# Patient Record
Sex: Female | Born: 2000
Health system: Southern US, Community
[De-identification: ages and names within clinical notes are randomized; demographics above are authoritative.]

## PROBLEM LIST (undated history)

## (undated) DIAGNOSIS — F819 Developmental disorder of scholastic skills, unspecified: Secondary | ICD-10-CM

## (undated) DIAGNOSIS — Z973 Presence of spectacles and contact lenses: Secondary | ICD-10-CM

## (undated) DIAGNOSIS — J302 Other seasonal allergic rhinitis: Secondary | ICD-10-CM

## (undated) DIAGNOSIS — F913 Oppositional defiant disorder: Secondary | ICD-10-CM

## (undated) DIAGNOSIS — J45909 Unspecified asthma, uncomplicated: Secondary | ICD-10-CM

## (undated) DIAGNOSIS — F909 Attention-deficit hyperactivity disorder, unspecified type: Secondary | ICD-10-CM

## (undated) DIAGNOSIS — IMO0001 Reserved for inherently not codable concepts without codable children: Secondary | ICD-10-CM

## (undated) DIAGNOSIS — Z9289 Personal history of other medical treatment: Secondary | ICD-10-CM

## (undated) DIAGNOSIS — R4689 Other symptoms and signs involving appearance and behavior: Secondary | ICD-10-CM

## (undated) DIAGNOSIS — IMO0002 Reserved for concepts with insufficient information to code with codable children: Secondary | ICD-10-CM

## (undated) HISTORY — DX: Reserved for concepts with insufficient information to code with codable children: IMO0002

## (undated) HISTORY — DX: Presence of spectacles and contact lenses: Z97.3

## (undated) HISTORY — DX: Other symptoms and signs involving appearance and behavior: R46.89

## (undated) HISTORY — DX: Attention-deficit hyperactivity disorder, unspecified type: F90.9

## (undated) HISTORY — DX: Reserved for inherently not codable concepts without codable children: IMO0001

## (undated) HISTORY — DX: Unspecified asthma, uncomplicated: J45.909

## (undated) HISTORY — PX: UMBILICAL HERNIA REPAIR: SUR1181

## (undated) HISTORY — PX: TONSILLECTOMY AND ADENOIDECTOMY: SUR1326

## (undated) HISTORY — DX: Developmental disorder of scholastic skills, unspecified: F81.9

## (undated) HISTORY — DX: Personal history of other medical treatment: Z92.89

## (undated) HISTORY — DX: Other seasonal allergic rhinitis: J30.2

## (undated) HISTORY — DX: Oppositional defiant disorder: F91.3

---
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
## 2022-01-06 ENCOUNTER — Encounter: Payer: Self-pay | Admitting: Internal Medicine

## 2022-02-19 ENCOUNTER — Ambulatory Visit: Payer: 59 | Admitting: Nurse Practitioner

## 2022-02-19 NOTE — Patient Instructions (Incomplete)
It was a pleasure seeing you today! Thank you for trusting me with your care.   We have ordered the following tests today: HCG- blood  This will test for positive pregnancy and give [place] an idea how far along you may be.   Even if you are not planning pregnancy at this time, I recommend that all women who could possibly get pregnant take a daily multivitamin with folic acid. This is a very important vitamin needed to help the spinal cord and brain form properly during Shayda Kalka pregnancy and it is best to have these levels normal at the time of pregnancy since this is the first area to develop. You can find prenatal vitamins in the vitamin section at stores that sell vitamins.    If labs were collected today, you will see the results as soon as they become available in MyChart. I will review these labs once I have received all of the results and send you comments and recommendations, if any. If you have specific concerns, we can set up an appointment (virtual or in person) to go over details and come up with a plan together.   If you have received any referrals today, the office where the referral was made will be in contact with you to set up your appointment.   If you have received orders for imaging today, the imaging office will contact you to schedule this. Please note that some imaging requires a prior authorization from insurance, so an order is not a guarantee this will be covered by your insurance, but I want to assure you we will do our best to get this covered and if it is not, you will be notified and we can come up with an alternative plan.   If you take regular prescription medications, please contact your pharmacy for routine refill requests. They will send this directly to [place].  If you were ordered new medication as a part of your examination and treatment today, please contact your pharmacy to determine the status. Many prescriptions require a prior authorization and the pharmacy will  contact us to get this started. This may take a few days for approval or denial. If the medication is denied, we will work with you to try alternative medications.   If you have any questions or concerns, please do not hesitate to contact the office via telephone or MyChart.  MyChart messages are received by the CMA staff during regular business hours Monday through Friday and we do our best to respond in a timely manner.  If your request requires an appointment, the staff will gladly help set that up so that we have the time dedicated to ensure that your questions are appropriately answered.

## 2022-02-19 NOTE — Progress Notes (Deleted)
  Tollie Eth, DNP, AGNP-c [facility] [address] [place], [place] [address] [phone]  Subjective:   Rosario Vazguez is a 21 y.o. female presents to day for evaluation of: Pregnancy Testing LMP   PMH, Medications, and Allergies reviewed and updated in chart as appropriate.   ROS negative except for what is listed in HPI. Objective:  There were no vitals taken for this visit. Physical Exam        Assessment & Plan:   Problem List Items Addressed This Visit   None     Tollie Eth, DNP, AGNP-c 02/19/2022  6:06 AM    History, Medications, Surgery, SDOH, and Family History reviewed and updated as appropriate.

## 2022-02-23 NOTE — L&D Delivery Note (Signed)
Delivery Note At 6:48 PM a non-viable female was delivered via Vaginal, Spontaneous (Presentation:   transverse, right arm).  APGAR: n/a; weight  pending.   Baby was delivered upon my arrival.  I delivered the placenta. Placenta status: Spontaneous, Intact.  Cord: 3VUnknown with the following complications:  None.  Cord pH: n/a  Anesthesia: None Episiotomy: None Lacerations: None Suture Repair:  none Est. Blood Loss (mL): 43  Mom to  ob specialty care .  Baby to [place]. Anora testing sent [place] 11/11/2022, 7:10 PM

## 2022-03-23 ENCOUNTER — Telehealth: Payer: Self-pay

## 2022-03-23 NOTE — Telephone Encounter (Signed)
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 Doing ok  Any questions or concerns? No  Items Reviewed: Did the pt receive and understand the discharge instructions provided? Yes  Medications obtained and verified? Yes  Other? No  Any new allergies since your discharge? No  Dietary orders reviewed? Yes Do you have support at home? Yes   Home Care and Equipment/Supplies: Were home health services ordered? no If so, what is the name of the agency? na  Has the agency set up a time to come to the patient's home? not applicable Were any new equipment or medical supplies ordered?  No What is the name of the medical supply agency? na Were you able to get the supplies/equipment? not applicable Do you have any questions related to the use of the equipment or supplies? No  Functional Questionnaire: (I = Independent and D = Dependent) ADLs: I  Bathing/Dressing- I  Meal Prep- I  Eating- I  Maintaining continence- I  Transferring/Ambulation- I  Managing Meds- I  Follow up appointments reviewed:  PCP Hospital f/u appt confirmed? No  . [facility] f/u appt confirmed? Yes  Patient plans to make fu appt with psychiatrist  Are transportation arrangements needed? No  If their condition worsens, is the pt aware to call PCP or go to the Emergency Dept.? Yes Was the patient provided with contact information for the PCP's office or ED? Yes Was to pt encouraged to call back with questions or concerns? Yes   Juanda Crumble LPN [place] Direct Dial [phone]

## 2022-07-16 ENCOUNTER — Ambulatory Visit: Payer: 59 | Admitting: Nurse Practitioner

## 2022-11-09 ENCOUNTER — Inpatient Hospital Stay (HOSPITAL_COMMUNITY)
Admission: AD | Admit: 2022-11-09 | Discharge: 2022-11-13 | DRG: 807 | Disposition: A | Discharge status: Home / Self Care | Payer: 59 | Attending: Obstetrics and Gynecology | Admitting: Obstetrics and Gynecology

## 2022-11-09 ENCOUNTER — Encounter (HOSPITAL_COMMUNITY): Payer: Self-pay | Admitting: Obstetrics and Gynecology

## 2022-11-09 ENCOUNTER — Inpatient Hospital Stay (HOSPITAL_BASED_OUTPATIENT_CLINIC_OR_DEPARTMENT_OTHER): Payer: 59

## 2022-11-09 ENCOUNTER — Other Ambulatory Visit: Payer: Self-pay

## 2022-11-09 DIAGNOSIS — F319 Bipolar disorder, unspecified: Secondary | ICD-10-CM | POA: Diagnosis present

## 2022-11-09 DIAGNOSIS — D509 Iron deficiency anemia, unspecified: Secondary | ICD-10-CM | POA: Diagnosis present

## 2022-11-09 DIAGNOSIS — F909 Attention-deficit hyperactivity disorder, unspecified type: Secondary | ICD-10-CM | POA: Diagnosis present

## 2022-11-09 DIAGNOSIS — Z79899 Other long term (current) drug therapy: Secondary | ICD-10-CM | POA: Diagnosis not present

## 2022-11-09 DIAGNOSIS — J45909 Unspecified asthma, uncomplicated: Secondary | ICD-10-CM | POA: Diagnosis present

## 2022-11-09 DIAGNOSIS — O364XX0 Maternal care for intrauterine death, not applicable or unspecified: Secondary | ICD-10-CM | POA: Diagnosis present

## 2022-11-09 DIAGNOSIS — O021 Missed abortion: Secondary | ICD-10-CM

## 2022-11-09 DIAGNOSIS — O99344 Other mental disorders complicating childbirth: Secondary | ICD-10-CM | POA: Diagnosis present

## 2022-11-09 DIAGNOSIS — F913 Oppositional defiant disorder: Secondary | ICD-10-CM | POA: Diagnosis present

## 2022-11-09 DIAGNOSIS — O9902 Anemia complicating childbirth: Secondary | ICD-10-CM | POA: Diagnosis present

## 2022-11-09 DIAGNOSIS — Z30017 Encounter for initial prescription of implantable subdermal contraceptive: Secondary | ICD-10-CM

## 2022-11-09 DIAGNOSIS — O9952 Diseases of the respiratory system complicating childbirth: Secondary | ICD-10-CM | POA: Diagnosis present

## 2022-11-09 DIAGNOSIS — Z825 Family history of asthma and other chronic lower respiratory diseases: Secondary | ICD-10-CM | POA: Diagnosis not present

## 2022-11-09 DIAGNOSIS — Z8659 Personal history of other mental and behavioral disorders: Secondary | ICD-10-CM

## 2022-11-09 DIAGNOSIS — Z8249 Family history of ischemic heart disease and other diseases of the circulatory system: Secondary | ICD-10-CM

## 2022-11-09 DIAGNOSIS — Z3A20 20 weeks gestation of pregnancy: Secondary | ICD-10-CM | POA: Diagnosis not present

## 2022-11-09 LAB — CBC WITH DIFFERENTIAL/PLATELET
Abs Immature Granulocytes: 0.01 10*3/uL (ref 0.00–0.07)
Basophils Absolute: 0 10*3/uL (ref 0.0–0.1)
Basophils Relative: 0 %
Eosinophils Absolute: 0.1 10*3/uL (ref 0.0–0.5)
Eosinophils Relative: 1 %
HCT: 31.5 % — ABNORMAL LOW (ref 36.0–46.0)
Hemoglobin: 10.4 g/dL — ABNORMAL LOW (ref 12.0–15.0)
Immature Granulocytes: 0 %
Lymphocytes Relative: 37 %
Lymphs Abs: 2.2 10*3/uL (ref 0.7–4.0)
MCH: 26.5 pg (ref 26.0–34.0)
MCHC: 33 g/dL (ref 30.0–36.0)
MCV: 80.4 fL (ref 80.0–100.0)
Monocytes Absolute: 0.6 10*3/uL (ref 0.1–1.0)
Monocytes Relative: 10 %
Neutro Abs: 3 10*3/uL (ref 1.7–7.7)
Neutrophils Relative %: 52 %
Platelets: 342 10*3/uL (ref 150–400)
RBC: 3.92 MIL/uL (ref 3.87–5.11)
RDW: 14.6 % (ref 11.5–15.5)
WBC: 5.8 10*3/uL (ref 4.0–10.5)
nRBC: 0 % (ref 0.0–0.2)

## 2022-11-09 LAB — OB RESULTS CONSOLE RPR: RPR: NONREACTIVE

## 2022-11-09 LAB — OB RESULTS CONSOLE RUBELLA ANTIBODY, IGM: Rubella: IMMUNE

## 2022-11-09 LAB — OB RESULTS CONSOLE HIV ANTIBODY (ROUTINE TESTING): HIV: NONREACTIVE

## 2022-11-09 LAB — OB RESULTS CONSOLE HEPATITIS B SURFACE ANTIGEN: Hepatitis B Surface Ag: NEGATIVE

## 2022-11-09 LAB — OB RESULTS CONSOLE ABO/RH: RH Type: POSITIVE

## 2022-11-09 MED ORDER — FENTANYL CITRATE (PF) 100 MCG/2ML IJ SOLN
50.0000 ug | INTRAMUSCULAR | Status: DC | PRN
  Administered 2022-11-11 (×3): 100 ug via INTRAVENOUS
  Filled 2022-11-09 (×3): qty 2

## 2022-11-09 MED ORDER — SERTRALINE HCL 50 MG PO TABS
75.0000 mg | ORAL_TABLET | Freq: Every day | ORAL | Status: DC
  Administered 2022-11-09 – 2022-11-11 (×3): 75 mg via ORAL
  Filled 2022-11-09: qty 1
  Filled 2022-11-09: qty 2
  Filled 2022-11-09 (×2): qty 1

## 2022-11-09 MED ORDER — ACETAMINOPHEN 325 MG PO TABS
650.0000 mg | ORAL_TABLET | ORAL | Status: DC | PRN
  Administered 2022-11-09: 650 mg via ORAL
  Filled 2022-11-09: qty 2

## 2022-11-09 MED ORDER — MISOPROSTOL 200 MCG PO TABS
200.0000 ug | ORAL_TABLET | ORAL | Status: AC | PRN
  Administered 2022-11-09 – 2022-11-10 (×3): 200 ug via VAGINAL
  Filled 2022-11-09 (×3): qty 1

## 2022-11-09 MED ORDER — LAMOTRIGINE 25 MG PO TABS
25.0000 mg | ORAL_TABLET | Freq: Two times a day (BID) | ORAL | Status: DC
  Administered 2022-11-09 – 2022-11-11 (×5): 25 mg via ORAL
  Filled 2022-11-09 (×7): qty 1

## 2022-11-09 NOTE — H&P (Addendum)
Bailey [name] is a 22 y.o. female G2P0010 at 20 weeks 6 days EGA by LMP of 06/16/22 and EDC of 03/23/2023 presenting with fetal demise detected in ultrasound at the office.  Patient reports no fetal movement detected today.   Prenatal care received at [facility].  Prenatal course significant for: Bipolar disorder and patient was on lamictal and sertaline since early pregnancy.  Fetal anomalies detected on anatomy ultrasound done 11/05/22: Abnormal cerebellum with flattened cerebellum, 3rd and 4th ventricles dilated, arachnoid cyst, pericardial effusion, multiple echogennic intracardiac foci, intrahepatic portal vein is prominent, with a linear vein course instead of curved, absent nasal bone, EFW 294 grams, 10 oz, 11th%.  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  Panorama test showed low risk female fetus. AFP only test was negative.  Inderteminate antibody screen on new OB labs, not clinically significant.  Repeat testing in 4 weeks recommended.    OB History   No obstetric history on file.    Past Medical History:  Diagnosis Date   ADHD (attention deficit hyperactivity disorder) 2002/04/13   psychoeducational eval, Era Skeen PhD.   Asthma    diagnosed age 5yo   Behavior problem 12/2011   psychological eval through school   History of EKG 2014   normal   Learning disability    Menarche 10/2014   Oppositional behavior    Seasonal allergic rhinitis    Wears glasses    Well child check 10/07/2015   Past Surgical History:  Procedure Laterality Date   TONSILLECTOMY AND ADENOIDECTOMY     age 84yo   UMBILICAL HERNIA REPAIR     age 70mo   Family History: family history includes Asthma in her brother, father, and sister; Cancer in her maternal grandfather and maternal grandmother; Fibromyalgia in her mother; Heart disease in her maternal grandmother; Hypertension in her maternal grandmother. Social History:  reports that she has  never smoked. She has never used smokeless tobacco. She reports that she does not drink alcohol and does not use drugs.     Maternal Diabetes: No Genetic Screening: Abnormal:  Results: Other: Maternal Ultrasounds/Referrals: Fetal Heart Anomalies, Isolated EIF (echogenic intracardiac focus), Absent nasal bone, and Other: Fetal Ultrasounds or other Referrals:  None Maternal Substance Abuse:  No Significant Maternal Medications:  See below.  Significant Maternal Lab Results:  Other:  Number of Prenatal Visits:greater than 3 verified prenatal visits Other Comments:  None  Review of Systems Constitutional: Denies fevers/chills Cardiovascular: Denies chest pain or palpitations Pulmonary: Denies coughing or wheezing Gastrointestinal: Denies nausea, vomiting or diarrhea Genitourinary: Denies pelvic pain, unusual vaginal bleeding, unusual vaginal discharge, dysuria, urgency or frequency.  Musculoskeletal: Denies muscle or joint aches and pain.  Neurology: Denies abnormal sensations such as tingling or numbness.    History   There were no vitals taken for this visit. Exam Constitutional: She is oriented to person, place, and time. She appears well-developed and well-nourished.  HENT:  Head: Normocephalic and atraumatic.  Eyes: Conjunctivae and EOM are normal.  Neck: Normal range of motion. Neck supple.  Cardiovascular: Normal rate, regular rhythm, normal heart sounds and intact distal pulses.  Respiratory: Effort normal and breath sounds normal.  GI: Soft. She exhibits no mass. There is no tenderness.  Genitourinary: Deferred.   Musculoskeletal: Normal range of motion.  Neurological: She is alert and oriented to person, place, and time.  Skin: Skin is warm and dry.  Psychiatric: She has a normal mood and affect. Judgment normal.  Prenatal labs: ABO, Rh:  B positive. Antibody:  Positive (non specific, clinically insignificant).  Rubella:  Immune.  RPR:   NR. HBsAg:  Neg.   HIV:   neg.  GBS:   Unknown.   Current Outpatient Medications  Medication Instructions   albuterol (VENTOLIN HFA) 108 (90 Base) MCG/ACT inhaler 1 puff, Inhalation, Every 6 hours PRN   Concerta 27 mg, Oral, Every morning   hydrOXYzine (ATARAX) 25 mg, Oral, 2 times daily   lamoTRIgine (LAMICTAL) 25 MG tablet Oral   montelukast (SINGULAIR) 10 MG tablet TAKE 1 TABLET BY MOUTH EVERYDAY AT BEDTIME   sertraline (ZOLOFT) 25 mg, Oral, Daily     No Known Allergies   Assessment/Plan: 22 y/o G2P0010 at 20 weeks 6 days EGA with intrauterine fetal demise, fetus with abnormalities detected on anatomy ultrasound:  - Admit to Labor and Delivery as per admit orders.   - I discussed with patient risks, benefits and alternatives of labor induction including but not limites to risks of bleeding, infection.  Patient expressed understanding of all this and desired to proceed with the induction.     [name] 11/09/2022, 6:34 PM

## 2022-11-09 NOTE — Progress Notes (Signed)
Bailey [name] is a 22 y.o. G2P0010 at [redacted]w[redacted]d by LMP admitted for induction of labor due to IUFD.  Subjective: Comfortable, resting in bed, would like to restart Lamictal and Sertraline and confirmed dose of Lamictal 25 mg BID and Sertraline 75 mgh at bedtime and ordered. Discussed R/B/A IOL for IUFD. All questions answered to patients satisfaction and she would like to proceed.   Objective: BP 114/73 (BP Location: Right Arm)   Pulse 93   Temp 98.2 F (36.8 C) (Oral)   Resp 16   Ht 5\' 11"  (1.803 m)   Wt 89.4 kg   LMP 06/16/2022   SpO2 100%   BMI 27.48 kg/m  No intake/output data recorded. No intake/output data recorded.  FHT:  n/a UC:   none SVE:   deferred, will check with cytotec placement PV Physical Exam Vitals reviewed.  Constitutional:      Appearance: Normal appearance.  HENT:     Nose: Nose normal.     Mouth/Throat:     Mouth: Mucous membranes are moist.     Pharynx: Oropharynx is clear.  Eyes:     Conjunctiva/sclera: Conjunctivae normal.  Cardiovascular:     Rate and Rhythm: Normal rate.  Pulmonary:     Effort: Pulmonary effort is normal. No respiratory distress.  Abdominal:     Palpations: Abdomen is soft.     Tenderness: There is no abdominal tenderness.  Neurological:     General: No focal deficit present.     Mental Status: She is alert and oriented to person, place, and time.  Psychiatric:        Mood and Affect: Mood normal.       Labs: Lab Results  Component Value Date   WBC 5.8 11/09/2022   HGB 10.4 (L) 11/09/2022   HCT 31.5 (L) 11/09/2022   MCV 80.4 11/09/2022   PLT 342 11/09/2022    Assessment / Plan: 22yo G2P0010 at [redacted]w[redacted]d IUFD  Multiple fetal anomalies Bipolar disorder - lamictal 25 BID & sertaline 75 at bedtime Oppositional defiant disorder ADHD  SMA carrier, Beta thal carrier.  Panorama low risk female  AFP negative Antibody screen positive  Cytotec 200 mcg PV q4hr SW consult PP Coags, KB, CMV, Parvo labs  pending Considering fetal autopsy  Jackie Plum, MD 11/09/2022, 10:20 PM

## 2022-11-10 DIAGNOSIS — O364XX0 Maternal care for intrauterine death, not applicable or unspecified: Principal | ICD-10-CM | POA: Diagnosis present

## 2022-11-10 LAB — PROTIME-INR
INR: 1 (ref 0.8–1.2)
Prothrombin Time: 13.7 s (ref 11.4–15.2)

## 2022-11-10 LAB — D-DIMER, QUANTITATIVE: D-Dimer, Quant: 0.46 ug{FEU}/mL (ref 0.00–0.50)

## 2022-11-10 LAB — KLEIHAUER-BETKE STAIN
Fetal Cells %: 0 %
Quantitation Fetal Hemoglobin: 0 mL

## 2022-11-10 LAB — ABO/RH: ABO/RH(D): B POS

## 2022-11-10 LAB — FIBRINOGEN: Fibrinogen: 549 mg/dL — ABNORMAL HIGH (ref 210–475)

## 2022-11-10 MED ORDER — MISOPROSTOL 200 MCG PO TABS
200.0000 ug | ORAL_TABLET | ORAL | Status: DC
  Administered 2022-11-10 (×2): 200 ug via VAGINAL
  Filled 2022-11-10 (×2): qty 1

## 2022-11-10 MED ORDER — MISOPROSTOL 200 MCG PO TABS
600.0000 ug | ORAL_TABLET | Freq: Once | ORAL | Status: AC
  Administered 2022-11-11: 600 ug via VAGINAL
  Filled 2022-11-10: qty 3

## 2022-11-10 MED ORDER — ZOLPIDEM TARTRATE 5 MG PO TABS
5.0000 mg | ORAL_TABLET | Freq: Once | ORAL | Status: AC
  Administered 2022-11-10: 5 mg via ORAL
  Filled 2022-11-10 (×2): qty 1

## 2022-11-10 MED ORDER — MISOPROSTOL 200 MCG PO TABS
400.0000 ug | ORAL_TABLET | ORAL | Status: AC
  Administered 2022-11-11 (×4): 400 ug via VAGINAL
  Filled 2022-11-10 (×4): qty 2

## 2022-11-10 NOTE — Progress Notes (Signed)
   11/10/22 1000  Spiritual Encounters  Type of Visit Initial  Care provided to: Pt and family  Conversation partners present during encounter Nurse  Referral source Nurse (RN/NT/LPN)  Reason for visit Grief/loss  OnCall Visit No  Spiritual Framework  Presenting Themes Significant life change;Caregiving needs  Community/Connection Family;Faith community  Patient Stress Factors None identified  Family Stress Factors None identified  Interventions  Spiritual Care Interventions Made Established relationship of care and support;Compassionate presence;Reflective listening  Intervention Outcomes  Outcomes Connection to spiritual care;Awareness around self/spiritual resourses;Connection to values and goals of care;Awareness of support  Spiritual Care Plan  Spiritual Care Issues Still Outstanding No further spiritual care needs at this time (see row info)   Chaplain responded to consult due to fetal loss. Pt was resting, her grandmother, father and mother were in the bedside. Pt was calm and expressed feeling well. Her mom said that their pastor is on the way to meet with them. Chaplain was present, offered spiritual care and support.   M.Kubra Denise Bramblett, MA Chaplain Intern [phone]

## 2022-11-10 NOTE — Progress Notes (Signed)
Bailey [name] is a 22 y.o. G2P0010 at [redacted]w[redacted]d by LMP admitted for induction of labor due to IUFD.  Subjective: Comfortable, resting in bed, not feeling contractions. S/p 3 doses of Cytotec 200 mcg PV q4hr, last dose at 0600.  Objective: BP 113/73 (BP Location: Right Arm)   Pulse 69   Temp 98.3 F (36.8 C) (Oral)   Resp 16   Ht 5\' 11"  (1.803 m)   Wt 89.4 kg   LMP 06/16/2022   SpO2 100%   BMI 27.48 kg/m  No intake/output data recorded. No intake/output data recorded.  FHT:  n/a UC:   none SVE:   FT per RN Physical Exam Vitals reviewed.  Constitutional:      Appearance: Normal appearance.  HENT:     Nose: Nose normal.     Mouth/Throat:     Mouth: Mucous membranes are moist.     Pharynx: Oropharynx is clear.  Eyes:     Conjunctiva/sclera: Conjunctivae normal.  Cardiovascular:     Rate and Rhythm: Normal rate.  Pulmonary:     Effort: Pulmonary effort is normal. No respiratory distress.  Abdominal:     Palpations: Abdomen is soft.     Tenderness: There is no abdominal tenderness.  Neurological:     General: No focal deficit present.     Mental Status: She is alert and oriented to person, place, and time.  Psychiatric:        Mood and Affect: Mood normal.    Dilation: Closed Effacement (%): Thick Station: [employer]: Lab Results  Component Value Date   WBC 5.8 11/09/2022   HGB 10.4 (L) 11/09/2022   HCT 31.5 (L) 11/09/2022   MCV 80.4 11/09/2022   PLT 342 11/09/2022    Assessment / Plan: 22yo G2P0010 at 110w0d IUFD  Multiple fetal anomalies Bipolar disorder - lamictal 25 BID & sertaline 75 at bedtime Oppositional defiant disorder ADHD  SMA carrier, Beta thal carrier.  Panorama low risk female  AFP negative Antibody screen positive  Cytotec 200 mcg PV q4hr SW consult PP Coags wnl KB neg CMV, Parvo labs pending Considering fetal autopsy SW consult PP  Jackie Plum, MD 11/10/2022, 6:09 AM

## 2022-11-10 NOTE — Progress Notes (Signed)
Pt is feeling okay BP 110/67   Pulse 74   Temp 98.1 F (36.7 C) (Oral)   Resp 16   Ht 5\' 11"  (1.803 m)   Wt 89.4 kg   LMP 06/16/2022   SpO2 100%   BMI 27.48 kg/m  IUFD at 21 weeks S/p five doses of cytotec If no change will do a 12 hour rest period per pharmacy and restart Pharmacy will check if laminaria is available

## 2022-11-11 ENCOUNTER — Encounter (HOSPITAL_COMMUNITY): Payer: Self-pay | Admitting: Obstetrics and Gynecology

## 2022-11-11 MED ORDER — SENNOSIDES-DOCUSATE SODIUM 8.6-50 MG PO TABS
2.0000 | ORAL_TABLET | Freq: Every day | ORAL | Status: DC
  Filled 2022-11-11: qty 2

## 2022-11-11 MED ORDER — TETANUS-DIPHTH-ACELL PERTUSSIS 5-2.5-18.5 LF-MCG/0.5 IM SUSY: 0.5000 mL | PREFILLED_SYRINGE | Freq: Once | INTRAMUSCULAR | Status: DC

## 2022-11-11 MED ORDER — BENZOCAINE-MENTHOL 20-0.5 % EX AERO: 1.0000 | INHALATION_SPRAY | CUTANEOUS | Status: DC | PRN

## 2022-11-11 MED ORDER — OXYTOCIN-SODIUM CHLORIDE 30-0.9 UT/500ML-% IV SOLN
1.0000 m[IU]/min | INTRAVENOUS | Status: DC
  Administered 2022-11-11: 1 m[IU]/min via INTRAVENOUS
  Filled 2022-11-11: qty 500

## 2022-11-11 MED ORDER — OXYTOCIN 10 UNIT/ML IJ SOLN
30.0000 [IU] | Freq: Once | INTRAMUSCULAR | Status: AC
  Administered 2022-11-11: 30 [IU]

## 2022-11-11 MED ORDER — OXYCODONE HCL 5 MG PO TABS: 10.0000 mg | ORAL_TABLET | ORAL | Status: DC | PRN

## 2022-11-11 MED ORDER — IBUPROFEN 600 MG PO TABS
600.0000 mg | ORAL_TABLET | Freq: Four times a day (QID) | ORAL | Status: DC
  Administered 2022-11-11 – 2022-11-12 (×5): 600 mg via ORAL
  Filled 2022-11-11 (×6): qty 1

## 2022-11-11 MED ORDER — METHYLPHENIDATE HCL ER (OSM) 27 MG PO TBCR: 27.0000 mg | EXTENDED_RELEASE_TABLET | Freq: Every morning | ORAL | Status: DC

## 2022-11-11 MED ORDER — ONDANSETRON HCL 4 MG PO TABS: 4.0000 mg | ORAL_TABLET | ORAL | Status: DC | PRN

## 2022-11-11 MED ORDER — SIMETHICONE 80 MG PO CHEW: 80.0000 mg | CHEWABLE_TABLET | ORAL | Status: DC | PRN

## 2022-11-11 MED ORDER — ZOLPIDEM TARTRATE 5 MG PO TABS
5.0000 mg | ORAL_TABLET | Freq: Every evening | ORAL | Status: DC | PRN
  Administered 2022-11-11 – 2022-11-12 (×2): 5 mg via ORAL
  Filled 2022-11-11 (×2): qty 1

## 2022-11-11 MED ORDER — WITCH HAZEL-GLYCERIN EX PADS: 1.0000 | MEDICATED_PAD | CUTANEOUS | Status: DC | PRN

## 2022-11-11 MED ORDER — ONDANSETRON HCL 4 MG/2ML IJ SOLN: 4.0000 mg | INTRAMUSCULAR | Status: DC | PRN

## 2022-11-11 MED ORDER — DIPHENHYDRAMINE HCL 25 MG PO CAPS: 25.0000 mg | ORAL_CAPSULE | Freq: Four times a day (QID) | ORAL | Status: DC | PRN

## 2022-11-11 MED ORDER — ZOLPIDEM TARTRATE 5 MG PO TABS: 5.0000 mg | ORAL_TABLET | Freq: Every evening | ORAL | Status: DC | PRN

## 2022-11-11 MED ORDER — COCONUT OIL OIL: 1.0000 | TOPICAL_OIL | Status: DC | PRN

## 2022-11-11 MED ORDER — OXYCODONE HCL 5 MG PO TABS: 5.0000 mg | ORAL_TABLET | ORAL | Status: DC | PRN

## 2022-11-11 MED ORDER — ACETAMINOPHEN 325 MG PO TABS: 650.0000 mg | ORAL_TABLET | ORAL | Status: DC | PRN

## 2022-11-11 MED ORDER — DIBUCAINE (PERIANAL) 1 % EX OINT: 1.0000 | TOPICAL_OINTMENT | CUTANEOUS | Status: DC | PRN

## 2022-11-11 MED ORDER — PRENATAL MULTIVITAMIN CH
1.0000 | ORAL_TABLET | Freq: Every day | ORAL | Status: DC
  Administered 2022-11-12: 1 via ORAL
  Filled 2022-11-11: qty 1

## 2022-11-11 NOTE — Progress Notes (Signed)
Patient requesting to go to the restroom for a bowel movement. SVE performed with no cervical change noted. Patient ambulating to restroom.

## 2022-11-11 NOTE — Progress Notes (Signed)
Reports feeling cramping S/p last ordered dose of cytotec Pt is 1-2cm/80%/bulging membranes Although early GA, will try pitocin to see if it will help with contractions Pt declined foley balloon Reviewed POC with pt

## 2022-11-12 DIAGNOSIS — Z8659 Personal history of other mental and behavioral disorders: Secondary | ICD-10-CM

## 2022-11-12 DIAGNOSIS — D509 Iron deficiency anemia, unspecified: Secondary | ICD-10-CM | POA: Diagnosis present

## 2022-11-12 LAB — CBC
HCT: 27.6 % — ABNORMAL LOW (ref 36.0–46.0)
Hemoglobin: 9.3 g/dL — ABNORMAL LOW (ref 12.0–15.0)
MCH: 27.4 pg (ref 26.0–34.0)
MCHC: 33.7 g/dL (ref 30.0–36.0)
MCV: 81.4 fL (ref 80.0–100.0)
Platelets: 309 10*3/uL (ref 150–400)
RBC: 3.39 MIL/uL — ABNORMAL LOW (ref 3.87–5.11)
RDW: 14.6 % (ref 11.5–15.5)
WBC: 6 10*3/uL (ref 4.0–10.5)
nRBC: 0 % (ref 0.0–0.2)

## 2022-11-12 MED ORDER — SERTRALINE HCL 50 MG PO TABS
100.0000 mg | ORAL_TABLET | Freq: Every day | ORAL | Status: DC
  Administered 2022-11-12: 100 mg via ORAL
  Filled 2022-11-12: qty 2

## 2022-11-12 MED ORDER — LAMOTRIGINE 100 MG PO TABS
100.0000 mg | ORAL_TABLET | Freq: Every day | ORAL | Status: DC
  Administered 2022-11-12 – 2022-11-13 (×2): 100 mg via ORAL
  Filled 2022-11-12 (×2): qty 1

## 2022-11-12 MED ORDER — POLYSACCHARIDE IRON COMPLEX 150 MG PO CAPS
150.0000 mg | ORAL_CAPSULE | Freq: Every day | ORAL | Status: DC
  Administered 2022-11-12 – 2022-11-13 (×2): 150 mg via ORAL
  Filled 2022-11-12 (×2): qty 1

## 2022-11-12 MED ORDER — METHYLPHENIDATE HCL ER (OSM) 27 MG PO TBCR
27.0000 mg | EXTENDED_RELEASE_TABLET | Freq: Every day | ORAL | Status: DC
  Administered 2022-11-12 – 2022-11-13 (×2): 27 mg via ORAL
  Filled 2022-11-12 (×2): qty 1

## 2022-11-12 NOTE — Progress Notes (Signed)
Subjective: Postpartum Day # 1 : S/P NSVD due to pt was admitted on 9/16at 20.6 for IOL for IUFD, +CMV IgG and + Parvovirus IgG, nora test send, no autopsy, h/o Bipolar on zoloft and Lamictal, and adhd on concerta, delivery vaginally after cytotec on the 9/18, ebl was , hgb 10.4.-9.3 on po iron with h/o IDA, blood loss at birth is insignificant to hospital stay.  Patient up ad lib, denies syncope or dizziness. Reports consuming regular diet without issues and denies N/V. Patient reports 0 bowel movement + passing flatus.  Denies issues with urination and reports bleeding is "lighter."  Patient is No feeding and reports going well.  Desires nexplanon for postpartum contraception.  Pain is being appropriately managed with use of po meds. Mood is teary eyed, but appropriate, pt still grieving, discussed increasing zoloft and Lamictal and restarting concerta, pt desires to restart r/t she endorses "it is quieter in my head, my thoughts are less on it". Pt given support and has family at bedside for support and doing well, discussed staying out of work for 6 weeks to grief and heal, pt will let discharge MD know if she needs a note.   No laceration Feeding:  No Contraceptive plan:  nexplanon Baby Female in room  Objective: Vital signs in last 24 hours: Patient Vitals for the past 24 hrs:  BP Temp Temp src Pulse Resp SpO2  11/12/22 0636 110/62 97.6 F (36.4 C) Oral 69 18 100 %  11/12/22 0302 122/69 98.3 F (36.8 C) Oral 83 16 100 %  11/11/22 2223 116/71 98 F (36.7 C) Oral 84 17 100 %  11/11/22 2140 126/71 98.2 F (36.8 C) Oral 78 16 100 %  11/11/22 2017 127/75 98 F (36.7 C) Oral 72 16 --  11/11/22 1916 119/79 -- -- 83 -- --  11/11/22 1903 124/63 -- -- 92 -- --  11/11/22 1854 126/74 -- -- 84 -- --  11/11/22 1740 108/64 -- -- 80 -- --  11/11/22 1636 122/78 -- -- 74 -- --  11/11/22 1514 118/74 98.2 F (36.8 C) Oral 80 18 --  11/11/22 1245 112/72 98.2 F (36.8 C) -- -- 18 --  11/11/22  1106 118/74 98.2 F (36.8 C) Oral 74 -- --     Physical Exam:  General: alert, cooperative, and appears stated age Mood/Affect: teared eyed, but able to laugh Lungs: clear to auscultation, no wheezes, rales or rhonchi, symmetric air entry.  Heart: normal rate, regular rhythm, normal S1, S2, no murmurs, rubs, clicks or gallops. Breast: breasts appear normal, no suspicious masses, no skin or nipple changes or axillary nodes. Abdomen:  + bowel sounds, soft, non-tender GU: perineum intact, healing well. No signs of external hematomas.  Uterine Fundus: firm Lochia: appropriate Skin: Warm, Dry. DVT Evaluation: No evidence of DVT seen on physical exam. Negative Homan's sign. No cords or calf tenderness. No significant calf/ankle edema.     Latest Ref Rng & Units 11/09/2022    9:08 PM 10/01/2021   11:59 AM 12/14/2019    4:22 PM  CBC  WBC 4.0 - 10.5 K/uL 5.8  3.4  6.6   Hemoglobin 12.0 - 15.0 g/dL 57.3  22.0  25.4   Hematocrit 36.0 - 46.0 % 31.5  35.2  34.9   Platelets 150 - 400 K/uL 342  304  335     No results found for this or any previous visit (from the past 24 hour(s)).   CBG (last 3)  No results  for input(s): "GLUCAP" in the last 72 hours.   I/O last 3 completed shifts: In: -  Out: 1143 [Urine:1100; Blood:43]   Assessment Postpartum Day # 1 : S/P NSVD due to pt was admitted on 9/16at 20.6 for IOL for IUFD, +CMV IgG and + Parvovirus IgG, nora test send, no autopsy, h/o Bipolar on zoloft and lamictal, and adhd on concerta, delivery vaginally after cytotec on the 9/18, ebl was , hgb 10.4.-9.3 on po iron with h/o IDA, blood loss at birth is insignificant to hospital stay.  Pt stable.  -2 involution. No feeding. Hemodynamically stable.   Plan: Continue other mgmt as ordered IDA: Po Iron  ADHD: continue concerta 27mg  Bipolar: increased lamictal to 100mg  and zoloft to 100mg  daily, monitor Mood, will f/u with her pych out pt.  VTE prophylactics: Early ambulated as tolerates.   Pain control: Motrin/Tylenol PRN Education given regarding options for contraception, including barrier methods, injectable contraception, IUD placement, oral contraceptives.  Plan for discharge tomorrow, Social Work consult, and Contraception Nexplanon  Dr. Alyse Low to be updated on patient status  [facility] CNM, FNP-C, PMHNP-BC  [phone]  [place], [place] [address]  Cell: [phone]  Office Phone: [phone] Fax: [phone] 11/12/2022  8:46 AM

## 2022-11-13 LAB — BPAM RBC
Blood Product Expiration Date: 202410192359
Blood Product Expiration Date: 202410192359
Unit Type and Rh: 7300
Unit Type and Rh: 7300

## 2022-11-13 LAB — TYPE AND SCREEN
ABO/RH(D): B POS
Antibody Screen: POSITIVE
Donor AG Type: NEGATIVE
Donor AG Type: NEGATIVE
Unit division: 0
Unit division: 0

## 2022-11-13 MED ORDER — LAMOTRIGINE 100 MG PO TABS: 100.0000 mg | ORAL_TABLET | Freq: Every day | ORAL | 2 refills | Status: AC

## 2022-11-13 MED ORDER — POLYSACCHARIDE IRON COMPLEX 150 MG PO CAPS: 150.0000 mg | ORAL_CAPSULE | Freq: Every day | ORAL | 2 refills | Status: AC

## 2022-11-13 MED ORDER — IBUPROFEN 600 MG PO TABS: 600.0000 mg | ORAL_TABLET | Freq: Four times a day (QID) | ORAL | 0 refills | Status: AC

## 2022-11-13 MED ORDER — SERTRALINE HCL 100 MG PO TABS: 100.0000 mg | ORAL_TABLET | Freq: Every day | ORAL | 2 refills | Status: AC

## 2022-11-13 MED ORDER — ACETAMINOPHEN 325 MG PO TABS: 650.0000 mg | ORAL_TABLET | ORAL | Status: AC | PRN

## 2022-11-13 NOTE — Progress Notes (Signed)
CSW received consult due to IUFD.  CSW available for support secondary to [facility] and will await call from Chaplain before becoming involved.  CSW screening out referral at this time.  Chaplin notified and to follow up with patient.   Celso Sickle, LCSW Clinical Social Worker [facility] Cell#: [phone]

## 2022-11-13 NOTE — Progress Notes (Signed)
   11/13/22 1038  Spiritual Encounters  Type of Visit Declined chaplain visit  Intervention Outcomes  Outcomes Declined chaplain visit   Chaplain responded to a page for support for a fetal demise. Some person in the room with the patient declined chaplain services.   Arlyce Dice, Chaplain Resident [phone]

## 2022-11-13 NOTE — Discharge Summary (Signed)
Postpartum Discharge Summary  Date of Service updated 11/13/22     Patient Name: Bailey [name] DOB: 10/28/2000 MRN: [id number]  Date of admission: 11/09/2022 Delivery date:11/11/2022 Delivering provider: Osborn Coho Date of discharge: 11/13/2022  Admitting diagnosis: IUFD at 20 weeks or more of gestation [O36.4XX0] Intrauterine pregnancy: [redacted]w[redacted]d     Secondary diagnosis:  Principal Problem:   IUFD at 20 weeks or more of gestation Active Problems:   Normal postpartum course   H/O bipolar disorder   Vaginal delivery   Iron deficiency anemia     Discharge diagnosis:  IUFD at >20 weeks                                               Post partum procedures: none Augmentation: Pitocin and Cytotec Complications: [facility] course: Induction of Labor With Vaginal Delivery   22 y.o. yo G2P0110 at [redacted]w[redacted]d was admitted to the hospital 11/09/2022 for induction of labor.  Indication for induction:  IUFD .  Patient had an uncomplicated labor course  Membrane Rupture Time/Date: 6:35 PM,11/11/2022  Delivery Method:Vaginal, Spontaneous Operative Delivery:N/A Episiotomy: None Lacerations:  None Details of delivery can be found in separate delivery note.  Patient had an uncomplicated postpartum course. Patient is discharged home 11/13/22.  Newborn Data: Birth date:11/11/2022 Birth time:6:48 PM Gender:Female Living status:Fetal Demise Apgars: ,  Weight:289 g  Magnesium Sulfate received: No BMZ received: No Rhophylac:N/A MMR:N/A RSV Vaccine received: No Transfusion:No Immunizations administered: Immunization History  Administered Date(s) Administered   DTaP 11/08/2002, 01/08/2003, 04/19/2003, 06/05/2004, 07/25/2007   HIB (PRP-OMP) 11/10/2002, 01/04/2003, 04/19/2003, 09/05/2003   HPV 9-valent 05/14/2015, 10/07/2015   HPV Quadrivalent 10/09/2014   Hepatitis A 01/12/2012   Hepatitis A, Ped/Adol-2 Dose 10/09/2014   Hepatitis B 05-07-02, 10/04/2002, 04/19/2003   IPV  11/08/2002, 01/08/2003, 09/05/2003, 07/25/2007   Influenza Split 01/20/2006, 03/07/2009, 01/12/2012   Influenza,inj,Quad PF,6+ Mos 12/14/2013, 10/09/2014, 12/02/2017, 12/05/2018, 11/13/2019, 10/24/2020   MMR 09/05/2003, 07/25/2007   Meningococcal B, OMV 10/01/2021   Meningococcal Conjugate 10/04/2013   Meningococcal Mcv4o 12/05/2018   PFIZER(Purple Top)SARS-COV-2 Vaccination 05/05/2019, 05/30/2019   Pneumococcal Conjugate-13 11/08/2002, 01/08/2003, 04/19/2003, 06/05/2004   Tdap 10/04/2013   Varicella 09/05/2003, 07/25/2007    Physical exam  Vitals:   11/12/22 0636 11/12/22 1157 11/12/22 1553 11/12/22 2311  BP: 110/62 114/73 124/74 125/78  Pulse: 69 81 88 91  Resp: 18 18 18 18   Temp: 97.6 F (36.4 C) 97.7 F (36.5 C) 97.9 F (36.6 C) 98.2 F (36.8 C)  TempSrc: Oral Oral Oral Oral  SpO2: 100% 100% 100% 100%  Weight:      Height:       General: alert, cooperative, and no distress Lochia: appropriate Uterine Fundus: firm Incision: N/A DVT Evaluation: No evidence of DVT seen on physical exam. No cords or calf tenderness. No significant calf/ankle edema. Labs: Lab Results  Component Value Date   WBC 6.0 11/12/2022   HGB 9.3 (L) 11/12/2022   HCT 27.6 (L) 11/12/2022   MCV 81.4 11/12/2022   PLT 309 11/12/2022      Latest Ref Rng & Units 12/14/2019    4:22 PM  CMP  Glucose 65 - 99 mg/dL 84   BUN 5 - 18 mg/dL 7   Creatinine 2.95 - 1.88 mg/dL 4.16   Sodium 606 - 301 mmol/L 138   Potassium 3.5 - 5.2 mmol/L 4.4  Chloride 96 - 106 mmol/L 103   CO2 20 - 29 mmol/L 22   Calcium 8.9 - 10.4 mg/dL 9.6   Total Protein 6.0 - 8.5 g/dL 7.3   Total Bilirubin 0.0 - 1.2 mg/dL 0.2   Alkaline Phos 47 - 113 IU/L 67   AST 0 - 40 IU/L 16   ALT 0 - 24 IU/L 15    Edinburgh Score:     No data to display            After visit meds:  Allergies as of 11/13/2022   No Known Allergies      Medication List     TAKE these medications    acetaminophen 325 MG tablet Commonly  known as: Tylenol Take 2 tablets (650 mg total) by mouth every 4 (four) hours as needed (for pain scale < 4).   albuterol 108 (90 Base) MCG/ACT inhaler Commonly known as: VENTOLIN HFA Inhale 1 puff into the lungs every 6 (six) hours as needed for wheezing or shortness of breath.   Concerta 27 MG CR tablet Generic drug: methylphenidate Take 27 mg by mouth every morning.   hydrOXYzine 25 MG tablet Commonly known as: ATARAX Take 25 mg by mouth 2 (two) times daily.   ibuprofen 600 MG tablet Commonly known as: ADVIL Take 1 tablet (600 mg total) by mouth every 6 (six) hours.   iron polysaccharides 150 MG capsule Commonly known as: NIFEREX Take 1 capsule (150 mg total) by mouth daily. Start taking on: November 14, 2022   lamoTRIgine 100 MG tablet Commonly known as: LAMICTAL Take 1 tablet (100 mg total) by mouth daily. Start taking on: November 14, 2022 What changed:  medication strength how much to take when to take this   montelukast 10 MG tablet Commonly known as: SINGULAIR TAKE 1 TABLET BY MOUTH EVERYDAY AT BEDTIME   sertraline 100 MG tablet Commonly known as: ZOLOFT Take 1 tablet (100 mg total) by mouth at bedtime. What changed:  medication strength how much to take when to take this         Discharge home in stable condition Infant Disposition:morgue Discharge instruction: per After Visit Summary and Postpartum booklet. Activity: Advance as tolerated. Pelvic rest for 6 weeks.  Diet: routine diet Anticipated Birth Control: Nexplanon Postpartum Appointment:6 weeks Additional Postpartum F/U: Postpartum Depression checkup Future Appointments:No future appointments. Follow up Visit:  Follow-up Information     [facility]. Schedule an appointment as soon as possible for a visit in 6 week(s).   Specialty: Obstetrics and Gynecology Contact information: [address] [place]  [address] [phone]                    11/13/2022 Roma Schanz, CNM

## 2022-11-16 LAB — SURGICAL PATHOLOGY

## 2022-11-19 LAB — ANORA MISCARRIAGE TEST - FRESH

## 2022-12-09 ENCOUNTER — Telehealth (HOSPITAL_COMMUNITY): Payer: Self-pay

## 2022-12-09 NOTE — Telephone Encounter (Signed)
12/09/2022 1125  Name: Bailey [name] MRN: [id number] DOB: 2001/01/28  Reason for Call:  Transition of Care Hospital Discharge Call  Contact Status: Patient Contact Status: Message  Language assistant needed: Interpreter Mode: Interpreter Not Needed        Follow-Up Questions:    [name] Postnatal Depression Scale:  In the Past 7 Days:    PHQ2-9 Depression Scale:     Discharge Follow-up:    Post-discharge interventions: NA  Signature  Signe Colt
